# Patient Record
Sex: Female | Born: 1955 | Race: White | Hispanic: No | Marital: Married | Smoking: Former smoker
Health system: Southern US, Community
[De-identification: ages and names within clinical notes are randomized; demographics above are authoritative.]

## PROBLEM LIST (undated history)

## (undated) DIAGNOSIS — Z833 Family history of diabetes mellitus: Secondary | ICD-10-CM

## (undated) DIAGNOSIS — Z8349 Family history of other endocrine, nutritional and metabolic diseases: Secondary | ICD-10-CM

## (undated) DIAGNOSIS — R42 Dizziness and giddiness: Secondary | ICD-10-CM

## (undated) DIAGNOSIS — Z86010 Personal history of colonic polyps: Secondary | ICD-10-CM

## (undated) DIAGNOSIS — Z800 Family history of malignant neoplasm of digestive organs: Secondary | ICD-10-CM

## (undated) DIAGNOSIS — Z83438 Family history of other disorder of lipoprotein metabolism and other lipidemia: Secondary | ICD-10-CM

## (undated) DIAGNOSIS — Z8249 Family history of ischemic heart disease and other diseases of the circulatory system: Secondary | ICD-10-CM

## (undated) DIAGNOSIS — R079 Chest pain, unspecified: Secondary | ICD-10-CM

## (undated) DIAGNOSIS — R0789 Other chest pain: Secondary | ICD-10-CM

## (undated) HISTORY — DX: Personal history of colonic polyps: Z86.010

## (undated) HISTORY — DX: Family history of ischemic heart disease and other diseases of the circulatory system: Z82.49

## (undated) HISTORY — PX: WISDOM TOOTH EXTRACTION: SHX21

## (undated) HISTORY — PX: OTHER SURGICAL HISTORY: SHX169

## (undated) HISTORY — DX: Family history of malignant neoplasm of digestive organs: Z80.0

## (undated) HISTORY — DX: Family history of other endocrine, nutritional and metabolic diseases: Z83.49

## (undated) HISTORY — DX: Chest pain, unspecified: R07.9

## (undated) HISTORY — DX: Family history of diabetes mellitus: Z83.3

## (undated) HISTORY — PX: CHOLECYSTECTOMY: SHX55

## (undated) HISTORY — DX: Dizziness and giddiness: R42

## (undated) HISTORY — DX: Family history of other disorder of lipoprotein metabolism and other lipidemia: Z83.438

## (undated) HISTORY — DX: Other chest pain: R07.89

---

## 2019-01-31 ENCOUNTER — Other Ambulatory Visit: Payer: Self-pay | Admitting: Family Medicine

## 2019-01-31 DIAGNOSIS — Z1231 Encounter for screening mammogram for malignant neoplasm of breast: Secondary | ICD-10-CM

## 2019-02-03 ENCOUNTER — Other Ambulatory Visit: Payer: Self-pay

## 2019-02-03 ENCOUNTER — Ambulatory Visit
Admission: RE | Admit: 2019-02-03 | Discharge: 2019-02-03 | Disposition: A | Discharge status: Home / Self Care | Payer: BC Managed Care – PPO | Source: Ambulatory Visit | Attending: Family Medicine | Admitting: Family Medicine

## 2019-02-03 DIAGNOSIS — Z1231 Encounter for screening mammogram for malignant neoplasm of breast: Secondary | ICD-10-CM

## 2019-12-26 ENCOUNTER — Other Ambulatory Visit: Payer: Self-pay | Admitting: Family Medicine

## 2019-12-26 DIAGNOSIS — Z1231 Encounter for screening mammogram for malignant neoplasm of breast: Secondary | ICD-10-CM

## 2020-02-06 ENCOUNTER — Ambulatory Visit: Payer: BC Managed Care – PPO

## 2020-05-01 ENCOUNTER — Other Ambulatory Visit: Payer: Self-pay

## 2020-05-01 ENCOUNTER — Ambulatory Visit
Admission: RE | Admit: 2020-05-01 | Discharge: 2020-05-01 | Disposition: A | Discharge status: Home / Self Care | Payer: Medicare PPO | Source: Ambulatory Visit | Attending: Family Medicine | Admitting: Family Medicine

## 2020-05-01 DIAGNOSIS — Z1231 Encounter for screening mammogram for malignant neoplasm of breast: Secondary | ICD-10-CM

## 2020-09-02 ENCOUNTER — Other Ambulatory Visit: Payer: Self-pay | Admitting: Family Medicine

## 2020-09-02 DIAGNOSIS — Z1231 Encounter for screening mammogram for malignant neoplasm of breast: Secondary | ICD-10-CM

## 2020-09-02 DIAGNOSIS — M8580 Other specified disorders of bone density and structure, unspecified site: Secondary | ICD-10-CM

## 2020-09-12 ENCOUNTER — Other Ambulatory Visit: Payer: Medicare PPO

## 2021-02-27 ENCOUNTER — Other Ambulatory Visit: Payer: Medicare PPO

## 2021-06-09 ENCOUNTER — Ambulatory Visit
Admission: RE | Admit: 2021-06-09 | Discharge: 2021-06-09 | Disposition: A | Discharge status: Home / Self Care | Payer: Medicare PPO | Source: Ambulatory Visit | Attending: Family Medicine | Admitting: Family Medicine

## 2021-06-09 DIAGNOSIS — Z1231 Encounter for screening mammogram for malignant neoplasm of breast: Secondary | ICD-10-CM

## 2021-06-10 ENCOUNTER — Other Ambulatory Visit: Payer: Self-pay | Admitting: Family Medicine

## 2021-06-10 DIAGNOSIS — R928 Other abnormal and inconclusive findings on diagnostic imaging of breast: Secondary | ICD-10-CM

## 2021-06-24 ENCOUNTER — Ambulatory Visit
Admission: RE | Admit: 2021-06-24 | Discharge: 2021-06-24 | Disposition: A | Discharge status: Home / Self Care | Payer: Medicare PPO | Source: Ambulatory Visit | Attending: Family Medicine | Admitting: Family Medicine

## 2021-06-24 ENCOUNTER — Other Ambulatory Visit: Payer: Self-pay | Admitting: Family Medicine

## 2021-06-24 DIAGNOSIS — R928 Other abnormal and inconclusive findings on diagnostic imaging of breast: Secondary | ICD-10-CM

## 2021-06-24 DIAGNOSIS — N6001 Solitary cyst of right breast: Secondary | ICD-10-CM

## 2021-07-24 ENCOUNTER — Encounter: Payer: Self-pay | Admitting: Interventional Cardiology

## 2021-07-24 ENCOUNTER — Ambulatory Visit: Payer: Medicare PPO | Admitting: Interventional Cardiology

## 2021-07-24 VITALS — BP 110/60 | HR 83 | Ht 66.0 in | Wt 197.0 lb

## 2021-07-24 DIAGNOSIS — E119 Type 2 diabetes mellitus without complications: Secondary | ICD-10-CM

## 2021-07-24 DIAGNOSIS — E782 Mixed hyperlipidemia: Secondary | ICD-10-CM | POA: Diagnosis not present

## 2021-07-24 DIAGNOSIS — R072 Precordial pain: Secondary | ICD-10-CM

## 2021-07-24 DIAGNOSIS — Z794 Long term (current) use of insulin: Secondary | ICD-10-CM

## 2021-07-24 MED ORDER — METOPROLOL TARTRATE 100 MG PO TABS: ORAL_TABLET | ORAL | 0 refills | Status: DC

## 2021-07-24 NOTE — Progress Notes (Signed)
Cardiology Office Note   Date:  07/24/2021   ID:  ALONDRIA MOUSSEAU, DOB [birth year], MRN [id number]  PCP:  Trisha Mangle, FNP    No chief complaint on file.  Chest discomfort  Wt Readings from Last 3 Encounters:  07/24/21 197 lb (89.4 kg)       History of Present Illness: Terri Rollins is a 66 y.o. female who is being seen today for the evaluation of chest discomfort at the request of Trisha Mangle, *.   At the end of June, she had some chest discomfort.  Records show: "developing some lightheadedness/dizziness with associated intermittent chest discomfort. Chest discomfort was dull, short lived and left sided. Reports feeling better Monday and Tuesday but this morning developed symptoms again, predominantly lightheaded/dizzy today. No recent medication changes. She did have recent UTI, treated with antibiotics. High risk for cardiac disease with family hx, diabetes, hypertension, hyperlipidemia and obesity.  "  One week ago was the first episode of chest discomfort.  No trigger.  It lasted several seconds and resolved,  REturned 20 minutes later.  No diaphoresis or arm pain associated. Had some dizziness.  No syncope.   She walks on a regular basis.  Walked 2 miles this morning in 40 minutes without cardiac sx.  HTN resolved with 87 lbs weight loss.  She lost it through diet control.  Minimized carbs in the diet. Minimized alcohol.  Past Medical History:  Diagnosis Date   Chest discomfort    Chest pain    Dizziness    Family history of colon cancer    Family history of diabetes mellitus (DM)    Family history of hyperlipidemia    Family history of hypertension    Family history of obesity    History of adenomatous polyp of colon    Lightheadedness     Past Surgical History:  Procedure Laterality Date   CESAREAN SECTION     CHOLECYSTECTOMY     COLONSCOPY     WISDOM TOOTH EXTRACTION       Current Outpatient Medications  Medication Sig Dispense  Refill   ARIPiprazole (ABILIFY) 2 MG tablet Take 4 mg by mouth daily.     buPROPion (WELLBUTRIN XL) 150 MG 24 hr tablet Take 150 mg by mouth daily.     buPROPion (WELLBUTRIN XL) 300 MG 24 hr tablet Take 300 mg by mouth daily.     escitalopram (LEXAPRO) 20 MG tablet Take 40 mg by mouth daily.     lisinopril-hydrochlorothiazide (ZESTORETIC) 20-12.5 MG tablet Take 1 tablet by mouth daily.     metFORMIN (GLUCOPHAGE) 500 MG tablet Take by mouth 2 (two) times daily with a meal.     rosuvastatin (CRESTOR) 10 MG tablet Take 10 mg by mouth daily.     No current facility-administered medications for this visit.    Allergies:   Patient has no known allergies.    Social History:  The patient  reports that she has quit smoking. Her smoking use included cigarettes. She has never used smokeless tobacco. She reports current alcohol use of about 1.0 standard drink of alcohol per week. She reports that she does not use drugs.   Family History:  The patient's family history includes Breast cancer in her mother; Cancer - Colon in her sister.    ROS:  Please see the history of present illness.   Otherwise, review of systems are positive for intentional weight loss, 87 pounds over the past year.   All  other systems are reviewed and negative.    PHYSICAL EXAM: VS:  BP 110/60   Pulse 83   Ht 5\' 6"  (1.676 m)   Wt 197 lb (89.4 kg)   SpO2 95%   BMI 31.80 kg/m  , BMI Body mass index is 31.8 kg/m. GEN: Well nourished, well developed, in no acute distress HEENT: normal Neck: no JVD, carotid bruits, or masses Cardiac: RRR; no murmurs, rubs, or gallops,no edema  Respiratory:  clear to auscultation bilaterally, normal work of breathing GI: soft, nontender, nondistended, + BS MS: no deformity or atrophy Skin: warm and dry, no rash Neuro:  Strength and sensation are intact Psych: euthymic mood, full affect   EKG:   The ekg ordered today demonstrates NSR, no ST changes   Recent Labs: No results found  for requested labs within last 365 days.   Lipid Panel No results found for: "CHOL", "TRIG", "HDL", "CHOLHDL", "VLDL", "LDLCALC", "LDLDIRECT"   Other studies Reviewed: Additional studies/ records that were reviewed today with results demonstrating:  labs reviewed: Creatinine 0.74, 70 LDL 85 in February 2023, HDL 44, triglycerides 91.   ASSESSMENT AND PLAN:  Chest discomfort: Some atypical features.  No family h/o CAD.  CT angiogram of the coronary arteries.  Rule out obstructive coronary artery disease.  Help determine a calcium score to adjust lipid lowering therapy goal.  Hyperlipidemia: Rosuvastatin 10 mg daily.  She eats a healthy diet.  She has lost weight through diet control.  HTN: resolved with weight loss.  DM: A1C was 9.6 but with weight loss, the A1c came down to 5.8.  still on metformin.    Current medicines are reviewed at length with the patient today.  The patient concerns regarding her medicines were addressed.  The following changes have been made:  No change  Labs/ tests ordered today include: CTA coronaries- metoprolol 100 mg x 1 No orders of the defined types were placed in this encounter.   Recommend 150 minutes/week of aerobic exercise Low fat, low carb, high fiber diet recommended  Disposition:   FU in 1 year and for CTA, sooner if CTA is abnormal   Signed, 08-28-1974, MD  07/24/2021 1:56 PM    [facility] [address] [place], [place], [place]  [place] Phone: [phone]; Fax: [phone]

## 2021-07-24 NOTE — Patient Instructions (Addendum)
Medication Instructions:  Your physician recommends that you continue on your current medications as directed. Please refer to the Current Medication list given to you today.  *If you need a refill on your cardiac medications before your next appointment, please call your pharmacy*  Lab Work: If you have labs (blood work) drawn today and your tests are completely normal, you will receive your results only by: MyChart Message (if you have MyChart) OR A paper copy in the mail If you have any lab test that is abnormal or we need to change your treatment, we will call you to review the results.  Testing/Procedures: Your physician has requested that you have cardiac CT. Cardiac computed tomography (CT) is a painless test that uses an x-ray machine to take clear, detailed pictures of your heart. For further information please visit [email or web address] Please follow instruction sheet as given.  Follow-Up: At [facility], you and your health needs are our priority.  As part of our continuing mission to provide you with exceptional heart care, we have created designated Provider Care Teams.  These Care Teams include your primary Cardiologist (physician) and Advanced Practice Providers (APPs -  Physician Assistants and Nurse Practitioners) who all work together to provide you with the care you need, when you need it.  We recommend signing up for the patient portal called "MyChart".  Sign up information is provided on this After Visit Summary.  MyChart is used to connect with patients for Virtual Visits (Telemedicine).  Patients are able to view lab/test results, encounter notes, upcoming appointments, etc.  Non-urgent messages can be sent to your provider as well.   To learn more about what you can do with MyChart, go to [email or web address].    Your next appointment:   12 month(s)  The format for your next appointment:   In Person  Provider:   Lance Muss, MD {  Other  Instructions   Your cardiac CT will be scheduled at one of the below locations:   [facility] [address] [place], [place] [address] [phone]  If scheduled at [facility], please arrive at the [facility] (Entrance C2) of [facility] 30 minutes prior to test start time. You can use the FREE valet parking offered at entrance C (encouraged to control the heart rate for the test)  Proceed to the [facility] Radiology Department (first floor) to check-in and test prep.  All radiology patients and guests should use entrance C2 at [facility], accessed from [facility], even though the hospital's physical address listed is [address].     Please follow these instructions carefully (unless otherwise directed):  On the Night Before the Test: Be sure to Drink plenty of water. Do not consume any caffeinated/decaffeinated beverages or chocolate 12 hours prior to your test. Do not take any antihistamines 12 hours prior to your test.  On the Day of the Test: Drink plenty of water until 1 hour prior to the test. Do not eat any food 4 hours prior to the test. You may take your regular medications prior to the test.  Take metoprolol (Lopressor) 100 mg two hours prior to test. HOLD lisinopril/Hydrochlorothiazide morning of the test. FEMALES- please wear underwire-free bra if available, avoid dresses & tight clothing      After the Test: Drink plenty of water. After receiving IV contrast, you may experience a mild flushed feeling. This is normal. On occasion, you may experience a mild  rash up to 24 hours after the test. This is not dangerous. If this occurs, you can take Benadryl 25 mg and increase your fluid intake. If you experience trouble breathing, this can be serious. If it is severe call 911 IMMEDIATELY. If it is mild, please call our office. If you take any of these medications: Metformin please do  not take 48 hours after completing test unless otherwise instructed.  We will call to schedule your test 2-4 weeks out understanding that some insurance companies will need an authorization prior to the service being performed.   For non-scheduling related questions, please contact the cardiac imaging nurse navigator should you have any questions/concerns: Rockwell Alexandria, Cardiac Imaging Nurse Navigator Larey Brick, Cardiac Imaging Nurse Navigator  Heart and Vascular Services Direct Office Dial: [phone]   For scheduling needs, including cancellations and rescheduling, please call [place], [phone].   Important Information About Sugar

## 2021-08-14 ENCOUNTER — Telehealth (HOSPITAL_COMMUNITY): Payer: Self-pay | Admitting: *Deleted

## 2021-08-14 NOTE — Telephone Encounter (Signed)
Reaching out to patient to offer assistance regarding upcoming cardiac imaging study; pt verbalizes understanding of appt date/time, parking situation and where to check in, pre-test NPO status and medications ordered, and verified current allergies; name and call back number provided for further questions should they arise  Larey Brick RN Navigator Cardiac Imaging [facility] [phone] office [phone] cell  Patient to take 100mg  metoprolol tartrate two hours prior to his cardiac CT scan. She is aware to arrive at noon.

## 2021-08-15 ENCOUNTER — Ambulatory Visit (HOSPITAL_COMMUNITY)
Admission: RE | Admit: 2021-08-15 | Discharge: 2021-08-15 | Disposition: A | Discharge status: Home / Self Care | Payer: Medicare PPO | Source: Ambulatory Visit | Attending: Interventional Cardiology | Admitting: Interventional Cardiology

## 2021-08-15 DIAGNOSIS — R072 Precordial pain: Secondary | ICD-10-CM | POA: Insufficient documentation

## 2021-08-15 MED ORDER — NITROGLYCERIN 0.4 MG SL SUBL
0.8000 mg | SUBLINGUAL_TABLET | Freq: Once | SUBLINGUAL | Status: AC
  Administered 2021-08-15: 0.8 mg via SUBLINGUAL

## 2021-08-15 MED ORDER — NITROGLYCERIN 0.4 MG SL SUBL
SUBLINGUAL_TABLET | SUBLINGUAL | Status: AC
  Filled 2021-08-15: qty 2

## 2021-08-15 MED ORDER — IOHEXOL 350 MG/ML SOLN
100.0000 mL | Freq: Once | INTRAVENOUS | Status: AC | PRN
  Administered 2021-08-15: 100 mL via INTRAVENOUS

## 2021-08-21 ENCOUNTER — Telehealth: Payer: Self-pay | Admitting: *Deleted

## 2021-08-21 DIAGNOSIS — E782 Mixed hyperlipidemia: Secondary | ICD-10-CM

## 2021-08-21 MED ORDER — ROSUVASTATIN CALCIUM 20 MG PO TABS: 20.0000 mg | ORAL_TABLET | Freq: Every day | ORAL | 3 refills | Status: AC

## 2021-08-21 NOTE — Telephone Encounter (Signed)
-----   Message from Corky Crafts, MD sent at 08/19/2021  3:53 PM EDT ----- Mild, nonobstructive coronary artery disease.  Calcium score is in the 85th percentile.  Would increase rosuvastatin to 20 mg daily for better prevention.  Check liver and lipids in 3 months.

## 2021-08-21 NOTE — Telephone Encounter (Signed)
I spoke with patient and reviewed results with her.  Prescription sent to [facility] mail order.  Patient will come in for fasting lab work on November 21, 2021

## 2021-08-21 NOTE — Telephone Encounter (Signed)
Left message to call office

## 2021-10-14 ENCOUNTER — Telehealth: Payer: Self-pay | Admitting: Interventional Cardiology

## 2021-10-14 NOTE — Telephone Encounter (Signed)
        Pt called per muscle / leg pain concern at night per message below.    Pt stated her PCP put her on Rosuvastatin 10 mg.  Dr. Irish Lack increased the dose to 20 mg.  Pt states on July 21, her leg / muscle pains began in her legs and are only present at night, when sleeping and while laying flat.    Pt states she is able to ambulate, exercise- walks in the mornings, and can sit / stand during daytime hours without ANY pain.    Pt educated on DVTS, as shared her concerns, but did not fit being asymptomatic during the day.    Pt told will forward medication dose concern to Dr. Irish Lack and RN; If symptoms worsen, or pain does not go away, Pt advised to call [facility] or go to nearest ER.

## 2021-10-14 NOTE — Telephone Encounter (Signed)
Pt c/o medication issue:  1. Name of Medication:   rosuvastatin (CRESTOR) 20 MG tablet  2. How are you currently taking this medication (dosage and times per day)?   As prescribed  3. Are you having a reaction (difficulty breathing--STAT)?   No  4. What is your medication issue?   Patient stated she is having muscle pain in her legs at night when she is in bed.  Patient stated when she is vertical it does not hurt.  Patient would like to get alternate medication or if she could use a lower dose of this medication.

## 2021-10-16 NOTE — Telephone Encounter (Signed)
I don't think that her leg pain is coming from the statin based on the pattern she describes.

## 2021-10-17 NOTE — Telephone Encounter (Signed)
Pt is returning call.  

## 2021-10-17 NOTE — Telephone Encounter (Signed)
I spoke with patient and gave her information from Dr Irish Lack.  Patient is seeing her PCP next week and will discuss leg pain at this appointment.

## 2021-10-17 NOTE — Telephone Encounter (Signed)
Left message to call office

## 2021-11-21 ENCOUNTER — Other Ambulatory Visit: Payer: Medicare PPO

## 2021-11-21 ENCOUNTER — Other Ambulatory Visit: Payer: Self-pay | Admitting: *Deleted

## 2021-11-21 DIAGNOSIS — M79604 Pain in right leg: Secondary | ICD-10-CM

## 2021-11-24 ENCOUNTER — Ambulatory Visit: Payer: Medicare PPO | Admitting: Surgery

## 2021-11-24 ENCOUNTER — Ambulatory Visit (HOSPITAL_COMMUNITY)
Admission: RE | Admit: 2021-11-24 | Discharge: 2021-11-24 | Disposition: A | Discharge status: Home / Self Care | Payer: Medicare PPO | Source: Ambulatory Visit | Attending: Surgery | Admitting: Surgery

## 2021-11-24 ENCOUNTER — Encounter: Payer: Self-pay | Admitting: Surgery

## 2021-11-24 VITALS — BP 136/88 | HR 68 | Temp 97.9°F | Resp 20 | Wt 220.0 lb

## 2021-11-24 DIAGNOSIS — M25562 Pain in left knee: Secondary | ICD-10-CM | POA: Diagnosis not present

## 2021-11-24 DIAGNOSIS — M79604 Pain in right leg: Secondary | ICD-10-CM | POA: Diagnosis present

## 2021-11-24 DIAGNOSIS — M79605 Pain in left leg: Secondary | ICD-10-CM | POA: Diagnosis present

## 2021-11-24 NOTE — Progress Notes (Signed)
[facility] [place]  Patient name: Terri Rollins MRN: [id number] DOB: 20-Dec-1955 Sex: female   REQUESTING PROVIDER:    Patrecia Pace   REASON FOR CONSULT:    Leg cramps  HISTORY OF PRESENT ILLNESS:   Terri Rollins is a 66 y.o. female, who is referred for evaluation of leg pain.  The patient states that she gets an aching type pain on the front and outside of her left leg when she lays down.  It occurs usually about 30 minutes after laying down.  It wakes her at night routinely.  It is affecting her sleep.  She states that this is not like a cramp or a charley horse.  She does not describe shooting or burning pains.  She does not endorse this leg swelling.  She does not have claudication symptoms.  She walks approximately 2 miles a day without difficulty.  She states that standing alleviates the discomfort.  PAST MEDICAL HISTORY    Past Medical History:  Diagnosis Date   Chest discomfort    Chest pain    Dizziness    Family history of colon cancer    Family history of diabetes mellitus (DM)    Family history of hyperlipidemia    Family history of hypertension    Family history of obesity    History of adenomatous polyp of colon    Lightheadedness      FAMILY HISTORY   Family History  Problem Relation Age of Onset   Breast cancer Mother    Cancer - Colon Sister     SOCIAL HISTORY:   Social History   Socioeconomic History   Marital status: Married    Spouse name: Not on file   Number of children: Not on file   Years of education: Not on file   Highest education level: Not on file  Occupational History   Not on file  Tobacco Use   Smoking status: Former    Types: Cigarettes   Smokeless tobacco: Never   Tobacco comments:    Quit 39 years ago  Substance and Sexual Activity   Alcohol use: Yes    Alcohol/week: 1.0 standard drink of alcohol    Types: 1 Glasses of wine per week    Comment: weeklky    Drug use: Never   Sexual activity: Yes    Partners: Male  Other Topics Concern   Not on file  Social History Narrative   Not on file   Social Determinants of Health   Financial Resource Strain: Not on file  Food Insecurity: Not on file  Transportation Needs: Not on file  Physical Activity: Not on file  Stress: Not on file  Social Connections: Not on file  Intimate Partner Violence: Not on file    ALLERGIES:    No Known Allergies  CURRENT MEDICATIONS:    Current Outpatient Medications  Medication Sig Dispense Refill   ARIPiprazole (ABILIFY) 2 MG tablet Take 4 mg by mouth daily.     buPROPion (WELLBUTRIN XL) 150 MG 24 hr tablet Take 150 mg by mouth daily.     buPROPion (WELLBUTRIN XL) 300 MG 24 hr tablet Take 300 mg by mouth daily.     escitalopram (LEXAPRO) 20 MG tablet Take 40 mg by mouth daily.     lisinopril-hydrochlorothiazide (ZESTORETIC) 20-12.5 MG tablet Take 1 tablet by mouth daily.     metFORMIN (GLUCOPHAGE) 500 MG tablet Take by mouth 2 (two) times daily with a meal.  rosuvastatin (CRESTOR) 20 MG tablet Take 1 tablet (20 mg total) by mouth daily. 90 tablet 3   No current facility-administered medications for this visit.    REVIEW OF SYSTEMS:   [X]  denotes positive finding, [ ]  denotes negative finding Cardiac  Comments:  Chest pain or chest pressure:    Shortness of breath upon exertion:    Short of breath when lying flat:    Irregular heart rhythm:        Vascular    Pain in calf, thigh, or hip brought on by ambulation:    Pain in feet at night that wakes you up from your sleep:  x   Blood clot in your veins:    Leg swelling:         Pulmonary    Oxygen at home:    Productive cough:     Wheezing:         Neurologic    Sudden weakness in arms or legs:     Sudden numbness in arms or legs:     Sudden onset of difficulty speaking or slurred speech:    Temporary loss of vision in one eye:     Problems with dizziness:         Gastrointestinal     Blood in stool:      Vomited blood:         Genitourinary    Burning when urinating:     Blood in urine:        Psychiatric    Major depression:         Hematologic    Bleeding problems:    Problems with blood clotting too easily:        Skin    Rashes or ulcers:        Constitutional    Fever or chills:     PHYSICAL EXAM:   Vitals:   11/24/21 0829  BP: 136/88  Pulse: 68  Resp: 20  Temp: 97.9 F (36.6 C)  SpO2: 95%  Weight: 220 lb (99.8 kg)    GENERAL: The patient is a well-nourished female, in no acute distress. The vital signs are documented above. CARDIAC: There is a regular rate and rhythm.  VASCULAR: Palpable pedal pulses PULMONARY: Nonlabored respirations ABDOMEN: Soft and non-tender  MUSCULOSKELETAL: There are no major deformities or cyanosis. NEUROLOGIC: No focal weakness or paresthesias are detected. SKIN: There are no ulcers or rashes noted. PSYCHIATRIC: The patient has a normal affect.  STUDIES:   I have reviewed the following: +-------+-----------+-----------+------------+------------+  ABI/TBIToday's ABIToday's TBIPrevious ABIPrevious TBI  +-------+-----------+-----------+------------+------------+  Right 1.16       0.55                                 +-------+-----------+-----------+------------+------------+  Left  1.19       0.71                                 +-------+-----------+-----------+------------+------------+  Right toe:  79 Left toe:  101  ASSESSMENT and PLAN   Leg pain: I discussed with patient that with palpable pulses and normal ABIs, I do not think that this is a arterial problem.  In addition she does not endorse a cramping or leg swelling and so I do not think that this is a venous problem.  I suspect that this is either neurogenic  or musculoskeletal.  I do not palpate any masses.  This does appear to affect her along her IT band.  Therefore, I suspect this is musculoskeletal.   Charlena Cross,  MD, FACS [facility] [phone] Pager [phone]

## 2021-12-26 ENCOUNTER — Ambulatory Visit
Admission: RE | Admit: 2021-12-26 | Discharge: 2021-12-26 | Disposition: A | Discharge status: Home / Self Care | Payer: Medicare PPO | Source: Ambulatory Visit | Attending: Family Medicine | Admitting: Family Medicine

## 2021-12-26 ENCOUNTER — Other Ambulatory Visit: Payer: Self-pay | Admitting: Family Medicine

## 2021-12-26 ENCOUNTER — Ambulatory Visit: Payer: Medicare PPO

## 2021-12-26 DIAGNOSIS — N6001 Solitary cyst of right breast: Secondary | ICD-10-CM

## 2022-06-29 ENCOUNTER — Ambulatory Visit
Admission: RE | Admit: 2022-06-29 | Discharge: 2022-06-29 | Disposition: A | Discharge status: Home / Self Care | Payer: Medicare PPO | Source: Ambulatory Visit | Attending: Family Medicine | Admitting: Family Medicine

## 2022-06-29 DIAGNOSIS — N6001 Solitary cyst of right breast: Secondary | ICD-10-CM

## 2023-07-14 IMAGING — MG MM DIGITAL DIAGNOSTIC UNILAT*R* W/ TOMO W/ CAD
8 series · 8 of 24 positions shown · non-contrast
Comparison: Previous exams including recent screening mammogram
dated 06/09/2021.

CLINICAL DATA: Patient returns today to evaluate possible RIGHT
breast asymmetries questioned on recent screening mammogram.

EXAM:
DIGITAL DIAGNOSTIC UNILATERAL RIGHT MAMMOGRAM WITH TOMOSYNTHESIS AND
CAD; ULTRASOUND RIGHT BREAST LIMITED
TECHNIQUE: Right digital diagnostic mammography and breast tomosynthesis was
performed. The images were evaluated with computer-aided detection.;
Targeted ultrasound examination of the right breast was performed

[R MLO synth-2D]
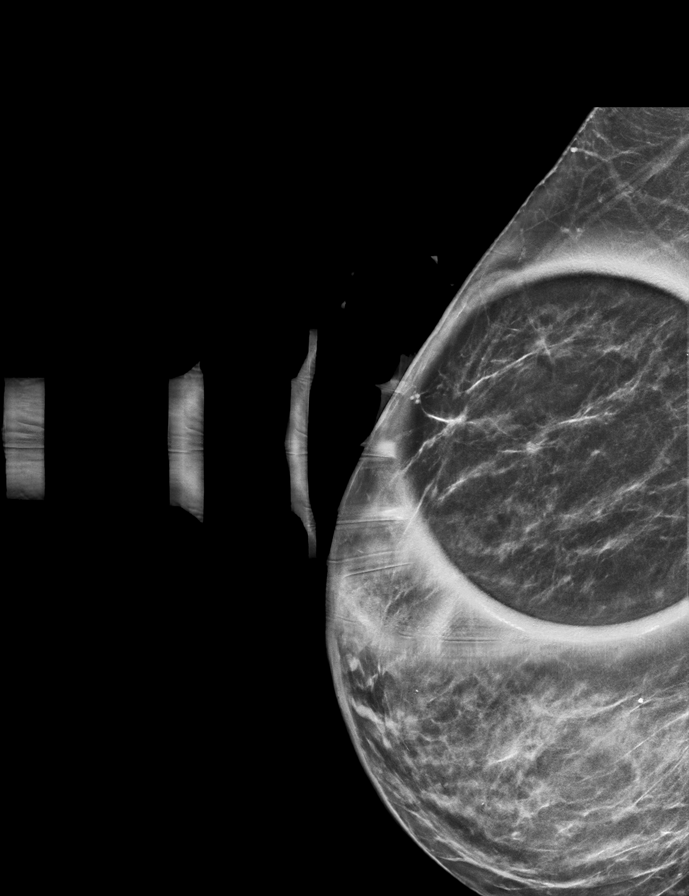

[R ML synth-2D]
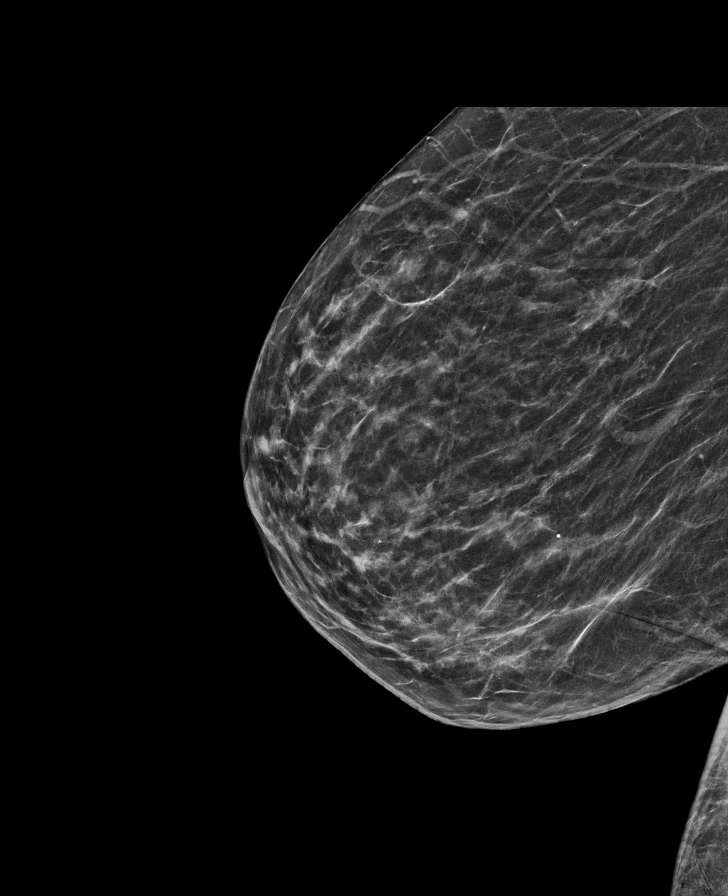

[R CC synth-2D (1 of 2)]
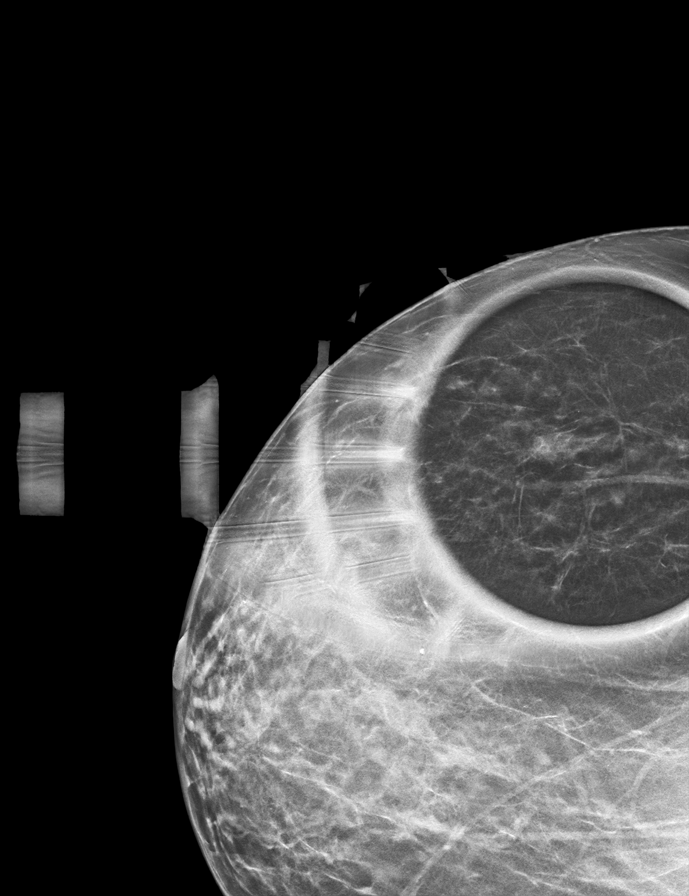

[R CC synth-2D (2 of 2)]
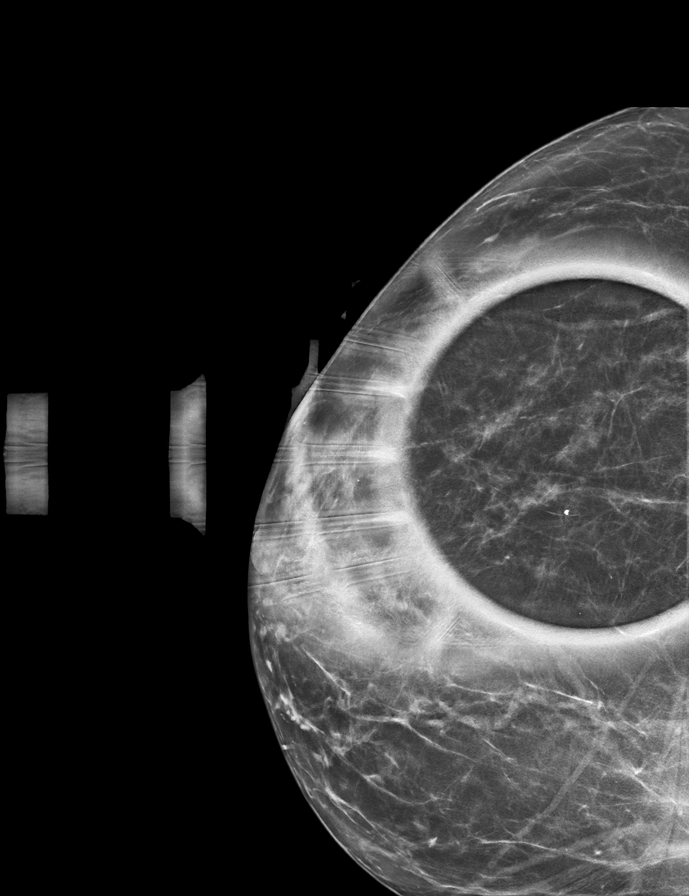

[R CC tomo (1 of 2) · tomo slice 24/47.0]
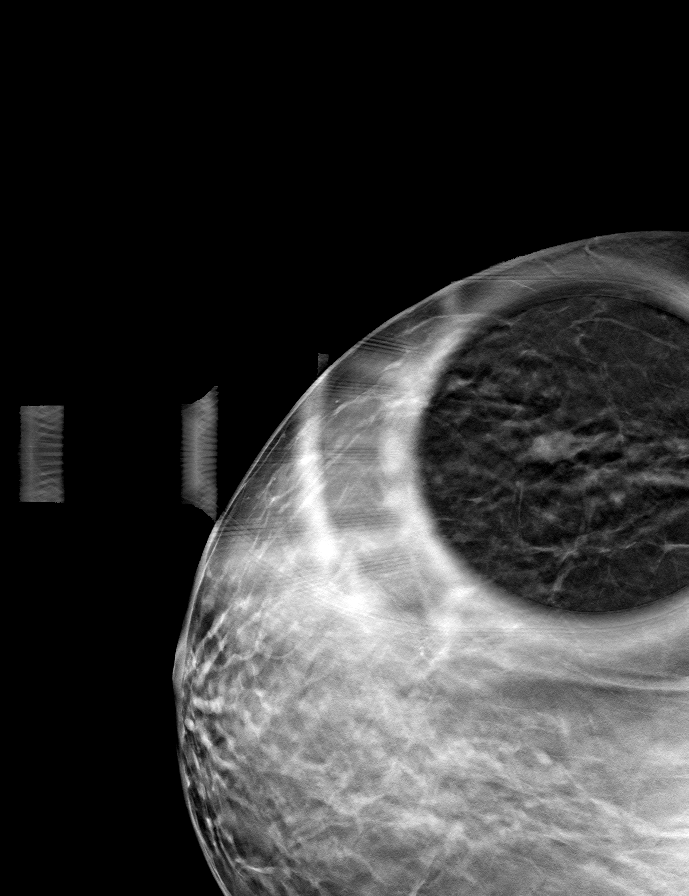

[R MLO tomo · tomo slice 31/61.0]
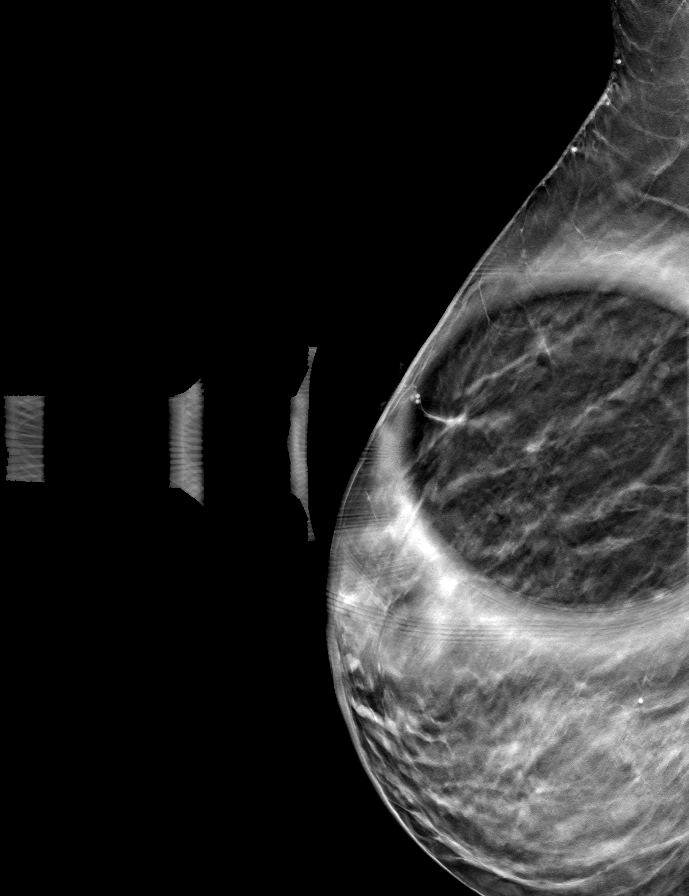

[R CC tomo (2 of 2) · tomo slice 29/57.0]
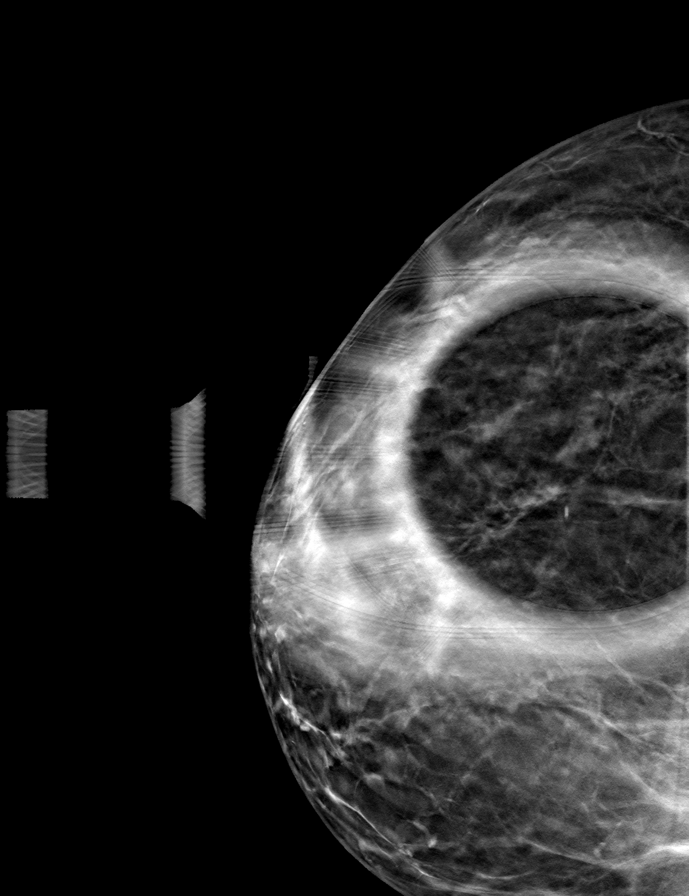

[R ML tomo · tomo slice 31/61.0]
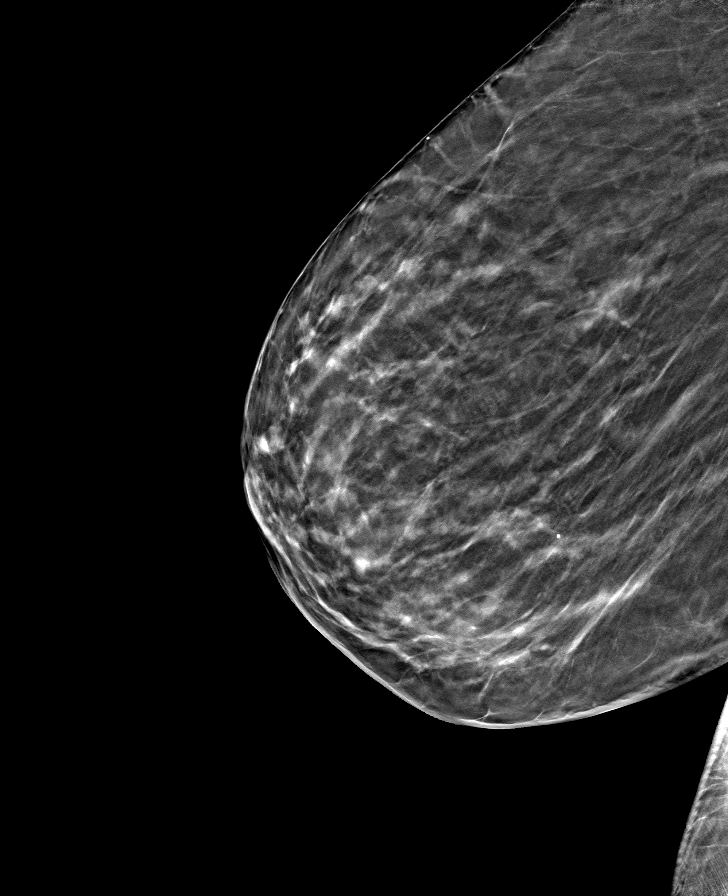

[8 of 24 positions shown; findings below may reference images not displayed]

ACR Breast Density Category b: There are scattered areas of
fibroglandular density.
FINDINGS: On today's additional diagnostic views, there is a persistent
asymmetry within the outer RIGHT breast, 9-10 o'clock axis,
measuring approximally 1 cm greatest dimension.

On today's additional diagnostic views, there is no persistent
asymmetry within the remainder of the RIGHT breast indicating
superimposition of normal fibroglandular tissues.

Targeted ultrasound is performed, showing small benign cysts within
the outer RIGHT breast, 9 to 9:30 o'clock axis, 7 cm from the
nipple, most suggestive of benign fibrocystic change, a likely
correlate for the mammographic finding. No suspicious solid or
cystic mass is identified within the outer RIGHT breast.
IMPRESSION: Probably benign asymmetry within the outer RIGHT breast, with
fibrocystic changes seen by ultrasound within the RIGHT breast at
the 9 to 9:30 o'clock axes which are a likely correlate for this
asymmetry. Recommend follow-up RIGHT breast diagnostic mammogram,
and possible ultrasound, in 6 months to ensure stability.

RECOMMENDATION:
RIGHT breast diagnostic mammogram, and possible RIGHT breast
ultrasound, in 6 months

I have discussed the findings and recommendations with the patient.
If applicable, a reminder letter will be sent to the patient
regarding the next appointment.

BI-RADS CATEGORY  3: Probably benign.

## 2023-07-14 IMAGING — US US BREAST*R* LIMITED INC AXILLA
1 series · 6 of 6 positions shown · non-contrast
Comparison: Previous exams including recent screening mammogram
dated 06/09/2021.

CLINICAL DATA: Patient returns today to evaluate possible RIGHT
breast asymmetries questioned on recent screening mammogram.

EXAM:
DIGITAL DIAGNOSTIC UNILATERAL RIGHT MAMMOGRAM WITH TOMOSYNTHESIS AND
CAD; ULTRASOUND RIGHT BREAST LIMITED
TECHNIQUE: Right digital diagnostic mammography and breast tomosynthesis was
performed. The images were evaluated with computer-aided detection.;
Targeted ultrasound examination of the right breast was performed

[Series 1: us breast*right* limited inc axilla · 0.06mm/px · 6 of 6 slices shown]
[im 1/6]
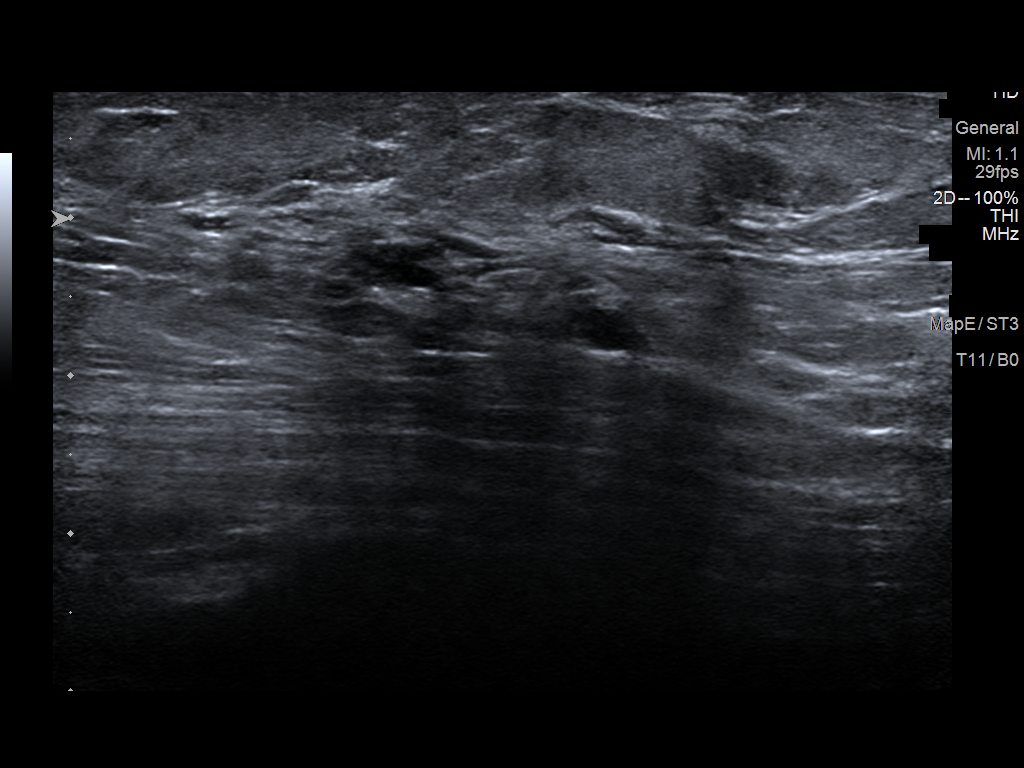
[im 2/6]
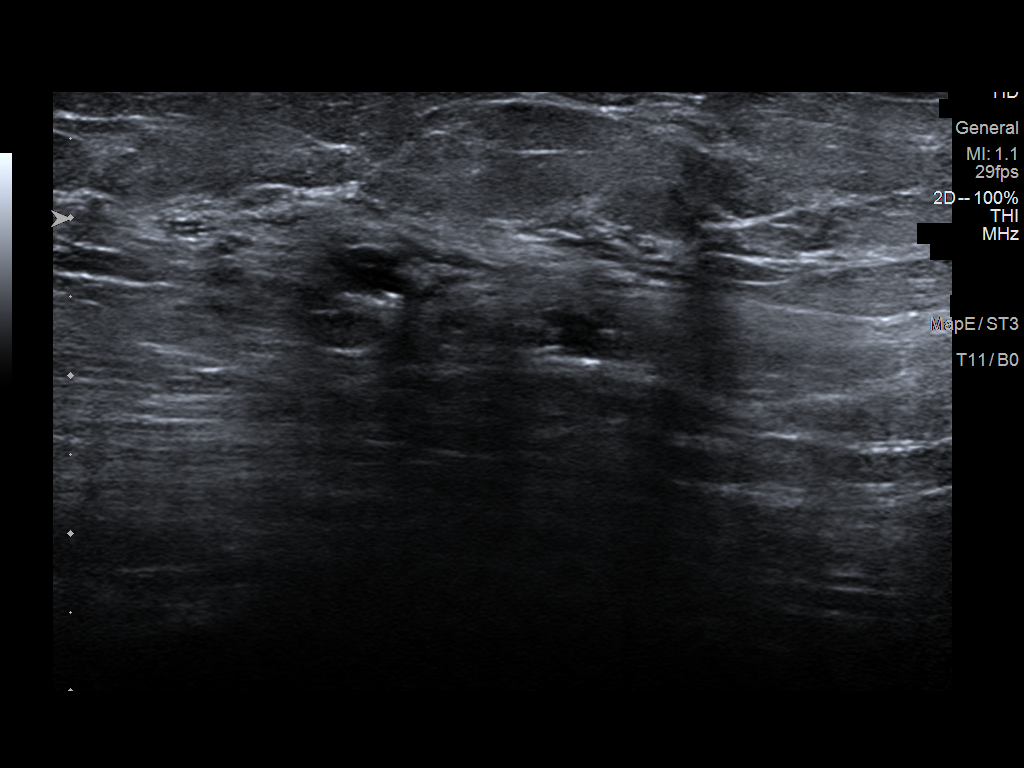
[im 3/6]
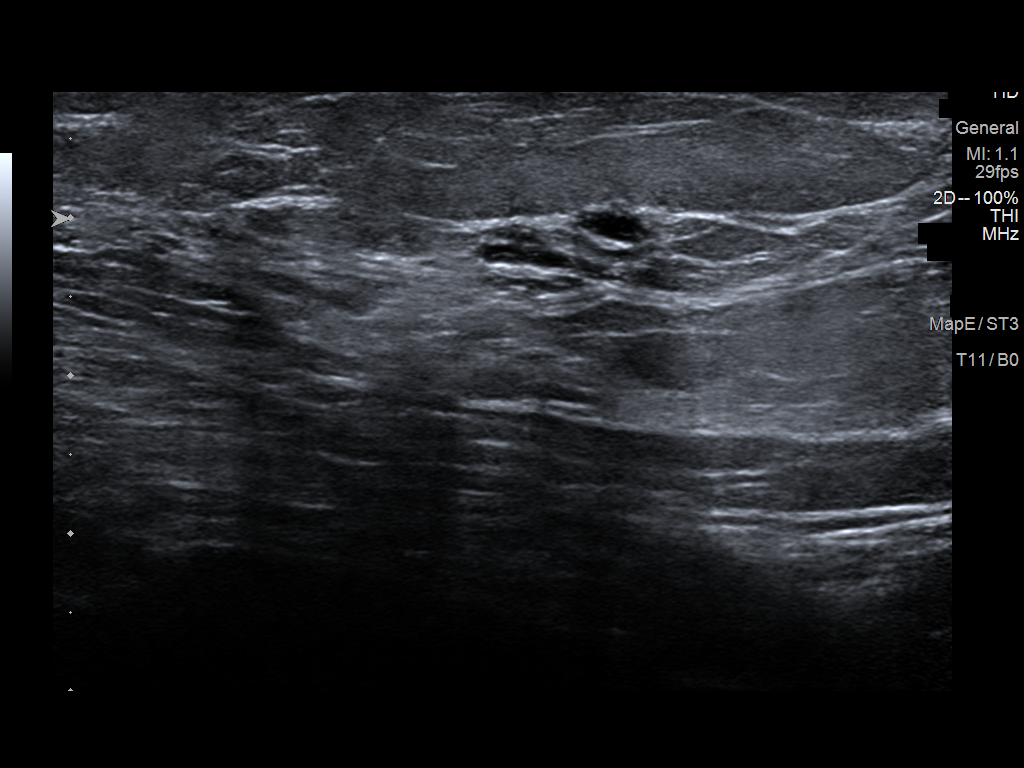
[im 4/6]
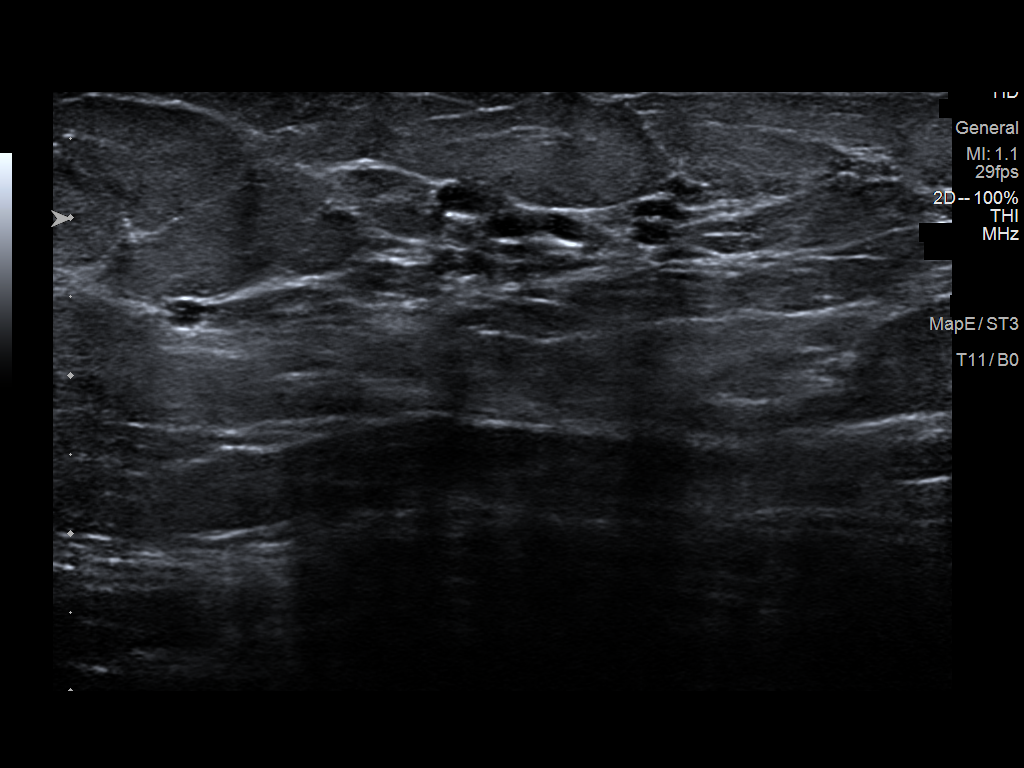
[im 5/6]
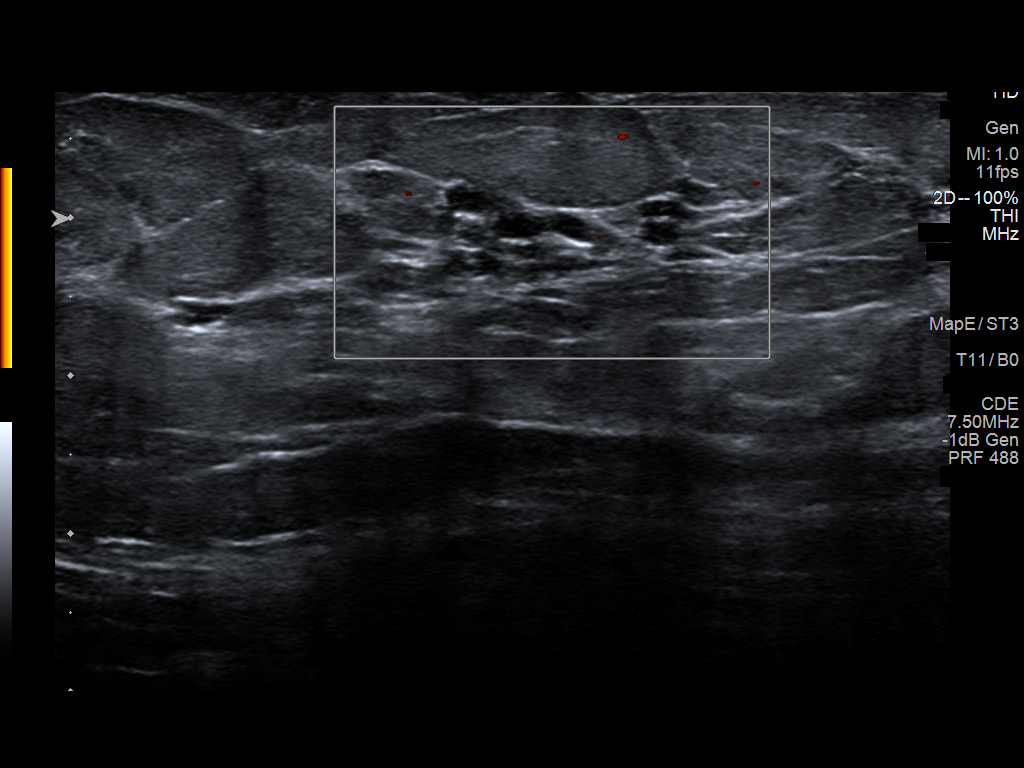
[im 6/6]
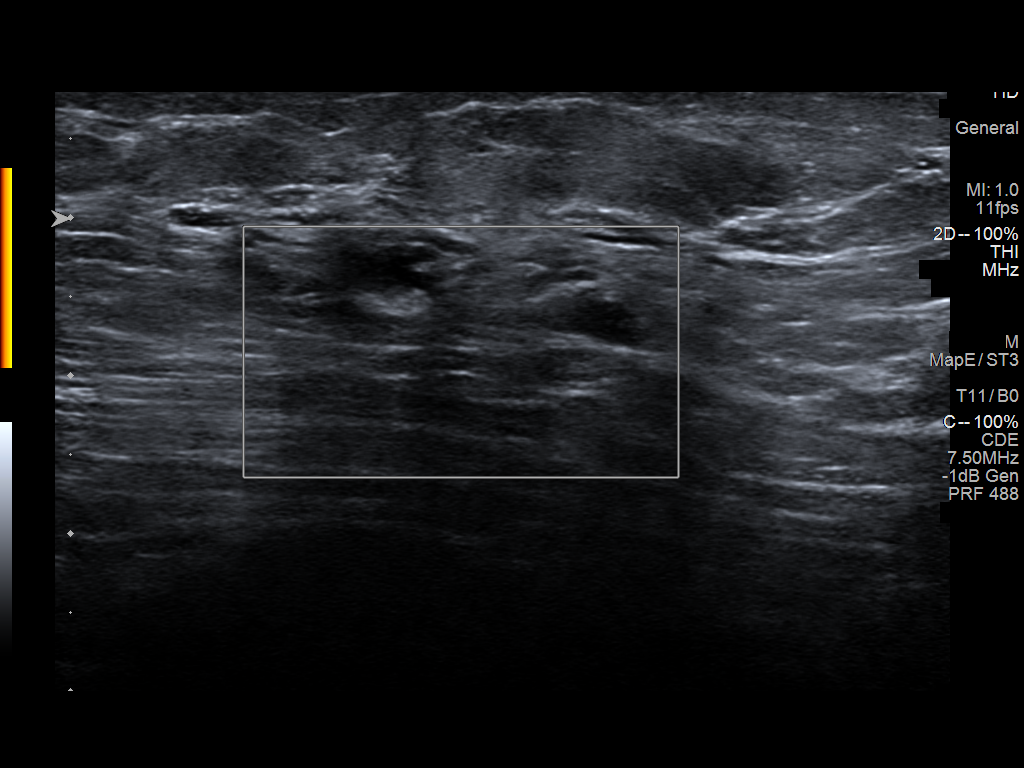

[6 of 6 positions shown; findings below may reference images not displayed]

ACR Breast Density Category b: There are scattered areas of
fibroglandular density.
FINDINGS: On today's additional diagnostic views, there is a persistent
asymmetry within the outer RIGHT breast, 9-10 o'clock axis,
measuring approximally 1 cm greatest dimension.

On today's additional diagnostic views, there is no persistent
asymmetry within the remainder of the RIGHT breast indicating
superimposition of normal fibroglandular tissues.

Targeted ultrasound is performed, showing small benign cysts within
the outer RIGHT breast, 9 to 9:30 o'clock axis, 7 cm from the
nipple, most suggestive of benign fibrocystic change, a likely
correlate for the mammographic finding. No suspicious solid or
cystic mass is identified within the outer RIGHT breast.
IMPRESSION: Probably benign asymmetry within the outer RIGHT breast, with
fibrocystic changes seen by ultrasound within the RIGHT breast at
the 9 to 9:30 o'clock axes which are a likely correlate for this
asymmetry. Recommend follow-up RIGHT breast diagnostic mammogram,
and possible ultrasound, in 6 months to ensure stability.

RECOMMENDATION:
RIGHT breast diagnostic mammogram, and possible RIGHT breast
ultrasound, in 6 months

I have discussed the findings and recommendations with the patient.
If applicable, a reminder letter will be sent to the patient
regarding the next appointment.

BI-RADS CATEGORY  3: Probably benign.
